# Patient Record
Sex: Male | Born: 1995 | Race: Black or African American | Hispanic: No | Marital: Single
Health system: Southern US, Community
[De-identification: ages and names within clinical notes are randomized; demographics above are authoritative.]

---

## 2018-02-20 ENCOUNTER — Emergency Department (HOSPITAL_COMMUNITY): Payer: BLUE CROSS/BLUE SHIELD

## 2018-02-20 ENCOUNTER — Emergency Department (HOSPITAL_COMMUNITY)
Admission: EM | Admit: 2018-02-20 | Discharge: 2018-02-20 | Disposition: A | Discharge status: Home / Self Care | Payer: BLUE CROSS/BLUE SHIELD | Attending: Emergency Medicine | Admitting: Emergency Medicine

## 2018-02-20 ENCOUNTER — Other Ambulatory Visit: Payer: Self-pay

## 2018-02-20 DIAGNOSIS — F1290 Cannabis use, unspecified, uncomplicated: Secondary | ICD-10-CM | POA: Insufficient documentation

## 2018-02-20 DIAGNOSIS — R112 Nausea with vomiting, unspecified: Secondary | ICD-10-CM | POA: Insufficient documentation

## 2018-02-20 DIAGNOSIS — R197 Diarrhea, unspecified: Secondary | ICD-10-CM | POA: Insufficient documentation

## 2018-02-20 DIAGNOSIS — R1084 Generalized abdominal pain: Secondary | ICD-10-CM | POA: Diagnosis not present

## 2018-02-20 LAB — CBC WITH DIFFERENTIAL/PLATELET
Abs Immature Granulocytes: 0.1 10*3/uL — ABNORMAL HIGH (ref 0.00–0.07)
BASOS ABS: 0 10*3/uL (ref 0.0–0.1)
Basophils Relative: 0 %
Eosinophils Absolute: 0 10*3/uL (ref 0.0–0.5)
Eosinophils Relative: 0 %
HEMATOCRIT: 42 % (ref 39.0–52.0)
HEMOGLOBIN: 12.7 g/dL — AB (ref 13.0–17.0)
IMMATURE GRANULOCYTES: 1 %
LYMPHS ABS: 2.4 10*3/uL (ref 0.7–4.0)
LYMPHS PCT: 15 %
MCH: 23.2 pg — ABNORMAL LOW (ref 26.0–34.0)
MCHC: 30.2 g/dL (ref 30.0–36.0)
MCV: 76.6 fL — ABNORMAL LOW (ref 80.0–100.0)
MONOS PCT: 8 %
Monocytes Absolute: 1.2 10*3/uL — ABNORMAL HIGH (ref 0.1–1.0)
NRBC: 0 % (ref 0.0–0.2)
Neutro Abs: 12.5 10*3/uL — ABNORMAL HIGH (ref 1.7–7.7)
Neutrophils Relative %: 76 %
Platelets: 249 10*3/uL (ref 150–400)
RBC: 5.48 MIL/uL (ref 4.22–5.81)
RDW: 13.5 % (ref 11.5–15.5)
WBC: 16.3 10*3/uL — ABNORMAL HIGH (ref 4.0–10.5)

## 2018-02-20 LAB — URINALYSIS, ROUTINE W REFLEX MICROSCOPIC
BILIRUBIN URINE: NEGATIVE
GLUCOSE, UA: NEGATIVE mg/dL
Hgb urine dipstick: NEGATIVE
Ketones, ur: NEGATIVE mg/dL
Leukocytes, UA: NEGATIVE
Nitrite: NEGATIVE
PH: 6 (ref 5.0–8.0)
Protein, ur: NEGATIVE mg/dL
Specific Gravity, Urine: 1.005 (ref 1.005–1.030)

## 2018-02-20 LAB — COMPREHENSIVE METABOLIC PANEL
ALBUMIN: 4.5 g/dL (ref 3.5–5.0)
ALK PHOS: 52 U/L (ref 38–126)
ALT: 19 U/L (ref 0–44)
AST: 27 U/L (ref 15–41)
Anion gap: 11 (ref 5–15)
BILIRUBIN TOTAL: 0.7 mg/dL (ref 0.3–1.2)
BUN: 19 mg/dL (ref 6–20)
CALCIUM: 9.1 mg/dL (ref 8.9–10.3)
CO2: 21 mmol/L — ABNORMAL LOW (ref 22–32)
Chloride: 99 mmol/L (ref 98–111)
Creatinine, Ser: 1.03 mg/dL (ref 0.61–1.24)
GFR calc Af Amer: >60 mL/min (ref >60)
GFR calc non Af Amer: >60 mL/min (ref >60)
GLUCOSE: 138 mg/dL — AB (ref 70–99)
Potassium: 3.2 mmol/L — ABNORMAL LOW (ref 3.5–5.1)
Sodium: 131 mmol/L — ABNORMAL LOW (ref 135–145)
TOTAL PROTEIN: 7.4 g/dL (ref 6.5–8.1)

## 2018-02-20 LAB — LIPASE, BLOOD: Lipase: 29 U/L (ref 11–51)

## 2018-02-20 MED ORDER — IOPAMIDOL (ISOVUE-300) INJECTION 61%
INTRAVENOUS | Status: AC
  Filled 2018-02-20: qty 100

## 2018-02-20 MED ORDER — IOPAMIDOL (ISOVUE-300) INJECTION 61%
100.0000 mL | Freq: Once | INTRAVENOUS | Status: AC | PRN
  Administered 2018-02-20: 100 mL via INTRAVENOUS

## 2018-02-20 MED ORDER — ONDANSETRON 4 MG PO TBDP: 4.0000 mg | ORAL_TABLET | Freq: Three times a day (TID) | ORAL | 0 refills | Status: AC | PRN

## 2018-02-20 MED ORDER — SODIUM CHLORIDE (PF) 0.9 % IJ SOLN
INTRAMUSCULAR | Status: AC
  Filled 2018-02-20: qty 50

## 2018-02-20 MED ORDER — ONDANSETRON 4 MG PO TBDP
4.0000 mg | ORAL_TABLET | Freq: Once | ORAL | Status: AC
  Administered 2018-02-20: 4 mg via ORAL
  Filled 2018-02-20: qty 1

## 2018-02-20 MED ORDER — FAMOTIDINE 20 MG PO TABS: 20.0000 mg | ORAL_TABLET | Freq: Two times a day (BID) | ORAL | 0 refills | Status: AC

## 2018-02-20 MED ORDER — DICYCLOMINE HCL 10 MG PO CAPS
10.0000 mg | ORAL_CAPSULE | Freq: Once | ORAL | Status: AC
  Administered 2018-02-20: 10 mg via ORAL
  Filled 2018-02-20: qty 1

## 2018-02-20 MED ORDER — POTASSIUM CHLORIDE CRYS ER 20 MEQ PO TBCR
40.0000 meq | EXTENDED_RELEASE_TABLET | Freq: Once | ORAL | Status: AC
  Administered 2018-02-20: 40 meq via ORAL
  Filled 2018-02-20: qty 2

## 2018-02-20 NOTE — ED Provider Notes (Signed)
[facility]-EMERGENCY DEPT Provider Note   CSN: [id number] Arrival date & time: 02/20/18  7829     History   Chief Complaint Chief Complaint  Patient presents with  . Abdominal Pain    HPI Clinton Gill is a 22 y.o. male with past medical history significant for iron deficiency anemia who presents for evaluation of abdominal pain.  Patient states he had a date yesterday evening and felt "full in my stomach so I took an enema."  Patient states that he was sitting on the toilet having a bowel movement when he states he had severe abdominal pain and his hands started trembling.  Patient states he had one episode of nonbloody, nonbilious emesis.  States he did "smoke a lot of weed right before the enema."  Admits to history of chronic marijuana use.  Patient states his roommate called EMS because he was worried about him.  Patient states his symptoms resolved and he drank 2 gallons of water to "rehydrate."  Patient states that he has continued to have mild abdominal cramping since his last bowel movement at approximately 1 AM.  Denies melena or bright red blood per rectum.  Patient states he usually takes enemas as he has chronic constipation.  Denies fever, chills, chest pain, shortness of breath, lightheadedness, dizziness, headache, vision changes, dysuria, focal abdominal pain.  Denies history of previous abdominal surgeries.  History obtained from patient.  No interpreter was used.  HPI  No past medical history on file.  There are no active problems to display for this patient.   The histories have been reviewed..   Home Medications    Prior to Admission medications   Medication Sig Start Date End Date Taking? Authorizing Provider  acetaminophen (TYLENOL) 500 MG tablet Take 1,000 mg by mouth daily as needed for headache.   Yes [provider]  famotidine (PEPCID) 20 MG tablet Take 1 tablet (20 mg total) by mouth 2 (two) times daily. 02/20/18   Lenin Kuhnle,  Chery Giusto A, PA-C  ondansetron (ZOFRAN ODT) 4 MG disintegrating tablet Take 1 tablet (4 mg total) by mouth every 8 (eight) hours as needed for nausea or vomiting. 02/20/18   Rickard Kennerly A, PA-C    Family History No family history on file.  Social History Social History   Tobacco Use  . Smoking status: Not on file  Substance Use Topics  . Alcohol use: Not on file  . Drug use: Not on file     Allergies   Penicillin g   Review of Systems Review of Systems  Constitutional: Negative.   Respiratory: Negative.   Cardiovascular: Negative.   Gastrointestinal: Positive for abdominal pain, diarrhea, nausea and vomiting. Negative for abdominal distention, anal bleeding, blood in stool, constipation and rectal pain.  Genitourinary: Negative.   Musculoskeletal: Negative.   Skin: Negative.   Neurological: Negative.   All other systems reviewed and are negative.    Physical Exam Updated Vital Signs BP 105/60 (BP Location: Left Arm)   Pulse 72   Temp 97.6 F (36.4 C) (Oral)   Resp 16   SpO2 99%   Physical Exam  Constitutional: He appears well-developed and well-nourished.  Non-toxic appearance. He does not appear ill. No distress.  HENT:  Head: Normocephalic and atraumatic.  Nose: Nose normal. Right sinus exhibits no maxillary sinus tenderness and no frontal sinus tenderness. Left sinus exhibits no maxillary sinus tenderness and no frontal sinus tenderness.  Mouth/Throat: Uvula is midline, oropharynx is clear and moist and mucous  membranes are normal. No trismus in the jaw. No uvula swelling. No oropharyngeal exudate, posterior oropharyngeal edema, posterior oropharyngeal erythema or tonsillar abscesses. No tonsillar exudate.  Eyes: Pupils are equal, round, and reactive to light. EOM and lids are normal.  Neck: Normal range of motion, full passive range of motion without pain and phonation normal. Neck supple. No neck rigidity. No edema, no erythema and normal range of motion  present.  Cardiovascular: Normal rate, regular rhythm, normal heart sounds, intact distal pulses and normal pulses. Exam reveals no gallop and no friction rub.  No murmur heard. Pulmonary/Chest: Effort normal and breath sounds normal. No accessory muscle usage or stridor. No tachypnea. No respiratory distress. He has no decreased breath sounds. He has no wheezes. He has no rhonchi. He has no rales. He exhibits no tenderness.  Abdominal: Soft. Normal appearance and bowel sounds are normal. He exhibits no shifting dullness, no distension, no pulsatile liver, no fluid wave, no abdominal bruit, no ascites, no pulsatile midline mass and no mass. There is no hepatosplenomegaly. There is no tenderness. There is no rigidity, no rebound, no guarding, no CVA tenderness, no tenderness at McBurney's point and negative Murphy's sign.  Musculoskeletal: Normal range of motion.  Moves all extremities without difficulty or without ataxia.  Neurological: He is alert. He has normal strength. He displays no atrophy and no tremor. No sensory deficit. He exhibits normal muscle tone. He displays no seizure activity.  Skin: Skin is warm and dry. He is not diaphoretic.  No rashes or lesions.  Psychiatric: He has a normal mood and affect.  Nursing note and vitals reviewed.    ED Treatments / Results  Labs (all labs ordered are listed, but only abnormal results are displayed) Labs Reviewed  CBC WITH DIFFERENTIAL/PLATELET - Abnormal; Notable for the following components:      Result Value   WBC 16.3 (*)    Hemoglobin 12.7 (*)    MCV 76.6 (*)    MCH 23.2 (*)    Neutro Abs 12.5 (*)    Monocytes Absolute 1.2 (*)    Abs Immature Granulocytes 0.10 (*)    All other components within normal limits  COMPREHENSIVE METABOLIC PANEL - Abnormal; Notable for the following components:   Sodium 131 (*)    Potassium 3.2 (*)    CO2 21 (*)    Glucose, Bld 138 (*)    All other components within normal limits  URINALYSIS, ROUTINE  W REFLEX MICROSCOPIC - Abnormal; Notable for the following components:   Color, Urine STRAW (*)    All other components within normal limits  LIPASE, BLOOD    EKG None  Radiology Ct Abdomen Pelvis W Contrast  Result Date: 02/20/2018 CLINICAL DATA:  Left-sided abdominal pain and nausea EXAM: CT ABDOMEN AND PELVIS WITH CONTRAST TECHNIQUE: Multidetector CT imaging of the abdomen and pelvis was performed using the standard protocol following bolus administration of intravenous contrast. CONTRAST:  ISOVUE-300 IOPAMIDOL (ISOVUE-300) INJECTION 61% COMPARISON:  None. FINDINGS: Lower chest: No acute abnormality. Hepatobiliary: No focal liver abnormality is seen. No gallstones, gallbladder wall thickening, or biliary dilatation. Pancreas: Unremarkable. No pancreatic ductal dilatation or surrounding inflammatory changes. Spleen: Normal in size without focal abnormality. Adrenals/Urinary Tract: Adrenal glands are unremarkable. Kidneys are normal, without renal calculi, focal lesion, or hydronephrosis. Bladder is unremarkable. Stomach/Bowel: Stomach is within normal limits. Appendix appears normal. No evidence of bowel wall thickening, distention, or inflammatory changes. Vascular/Lymphatic: No significant vascular findings are present. No enlarged abdominal or pelvic lymph  nodes. Reproductive: Prostate is unremarkable. Other: No abdominal wall hernia or abnormality. No abdominopelvic ascites. Musculoskeletal: No acute or significant osseous findings. IMPRESSION: No acute abnormality identified Electronically Signed   By: Alcide CleverMark  Lukens M.D.   On: 02/20/2018 09:51    Procedures Procedures (including critical care time)  Medications Ordered in ED Medications  dicyclomine (BENTYL) capsule 10 mg (10 mg Oral Given 02/20/18 0730)  ondansetron (ZOFRAN-ODT) disintegrating tablet 4 mg (4 mg Oral Given 02/20/18 0730)  potassium chloride SA (K-DUR,KLOR-CON) CR tablet 40 mEq (40 mEq Oral Given 02/20/18 0949)    iopamidol (ISOVUE-300) 61 % injection 100 mL (100 mLs Intravenous Contrast Given 02/20/18 0931)     Initial Impression / Assessment and Plan / ED Course  I have reviewed the triage vital signs and the nursing notes.  Pertinent labs & imaging results that were available during my care of the patient were reviewed by me and considered in my medical decision making (see chart for details).  22 year old male who appears otherwise well presents for evaluation of abdominal pain.  One episode of nonbloody, nonbilious emesis with one episode of diarrhea without melena or bright red blood per rectum.  Admits to history of chronic marijuana use.  Did use enema yesterday evening before his episode of diarrhea.  Admits to history of chronic constipation.  States he drank 2 gallons of water and attempt to rehydrate after his episode of diarrhea.  Was able to tolerate p.o. intake without subsequent episodes of emesis.  Abdomen without tenderness, rebound, guarding or rigidity.  Mucous membranes moist. Will obtain labs, urinalysis and re-evalaute. Will hold on IV fluids as patient recently drank 2 gallons of water and does not appear clinically dehydrated on exam.  0830: On re-evaluation patient without abdominal pain and has not had any episodes of emesis in department.  Labs with leukocytosis at 16.3, mild hypokalemia 3.2, provide supplementation department., mild hyponatremia at 131, elevation in glucose at 136, urinalysis not consistent with infection.  Given elevated WBC will obtain CT scan.  1000: CT scan negative for any acute abdominal pathology.  Abdomen nontender without rebound, rigidity or guarding on reevaluation. Patient is nontoxic, nonseptic appearing, in no apparent distress. Patient does not meet the SIRS or Sepsis criteria.  On repeat exam patient does not have a surgical abdomin and there are no peritoneal signs.  No indication of appendicitis, bowel obstruction, bowel perforation, cholecystitis,  diverticulitis.  Patient discharged home with symptomatic treatment and given strict instructions for follow-up with their primary care physician.  I have also discussed reasons to return immediately to the ER.  Patient expresses understanding and agrees with plan.    Final Clinical Impressions(s) / ED Diagnoses   Final diagnoses:  Generalized abdominal pain  Non-intractable vomiting with nausea, unspecified vomiting type    ED Discharge Orders         Ordered    ondansetron (ZOFRAN ODT) 4 MG disintegrating tablet  Every 8 hours PRN     02/20/18 1026    famotidine (PEPCID) 20 MG tablet  2 times daily     02/20/18 1026           Delonta Yohannes A, PA-C 02/20/18 1553    Terrilee FilesButler, Michael C, MD 02/20/18 1743

## 2018-02-20 NOTE — ED Notes (Signed)
Patient transported to CT 

## 2018-02-20 NOTE — ED Notes (Signed)
Pt has had several drinks of water with his meds and tolerated well.

## 2018-02-20 NOTE — ED Notes (Signed)
Pt made aware of need for urine sample.  

## 2018-02-20 NOTE — Discharge Instructions (Addendum)
Your evaluated today for abdominal pain as well as nausea and vomiting.  This is most likely from a combination of marijuana use as well as the laxative you took.  I have given you Zofran for nausea.  This is a disintegrating tablet which he placed on your tongue.  We suggest you stop smoking marijuana.   Return to the ED for new or worsening symptoms such as:  Get help right away if:  Your pain does not go away as soon as your doctor says it should. You cannot stop throwing up. Your pain is only in areas of your belly, such as the right side or the left lower part of the belly. You have bloody or black poop, or poop that looks like tar. You have very bad pain, cramping, or bloating in your belly. You have signs of not having enough fluid or water in your body (dehydration), such as: Dark pee, very little pee, or no pee. Cracked lips. Dry mouth. Sunken eyes. Sleepiness. Weakness.

## 2018-02-20 NOTE — ED Triage Notes (Signed)
Pt's roommates called 911 because pt was throwing up, shaking, pt c/o abdominal pain, and because he was not acting like his normal self.  Pt had an enema tonight to prepare for a date.  Pt drank 2 gallons of water tonight in attempt to rehydrate.  Pt admits to marijuana use tonight.  Hx Iron deficiency anemia.

## 2019-03-10 ENCOUNTER — Other Ambulatory Visit: Payer: Self-pay

## 2019-03-10 DIAGNOSIS — Z20822 Contact with and (suspected) exposure to covid-19: Secondary | ICD-10-CM

## 2019-03-12 LAB — NOVEL CORONAVIRUS, NAA: SARS-CoV-2, NAA: NOT DETECTED

## 2019-05-10 IMAGING — CT CT ABD-PELV W/ CM
2 of 4 series · 16 of 46 positions shown, 18 images · IV contrast (iopamidol)
Comparison: None.

CLINICAL DATA: Left-sided abdominal pain and nausea

EXAM:
CT ABDOMEN AND PELVIS WITH CONTRAST
TECHNIQUE: Multidetector CT imaging of the abdomen and pelvis was performed
using the standard protocol following bolus administration of
intravenous contrast.
CONTRAST:  100mL MQGYRR-YNN IOPAMIDOL (MQGYRR-YNN) INJECTION 61%

[Series 2: axial st · axial · 0.75mm/px · z∈[+1116,+1556]mm · 13 of 98 slices shown, 15 images]
[im 5/98  soft-tissue]
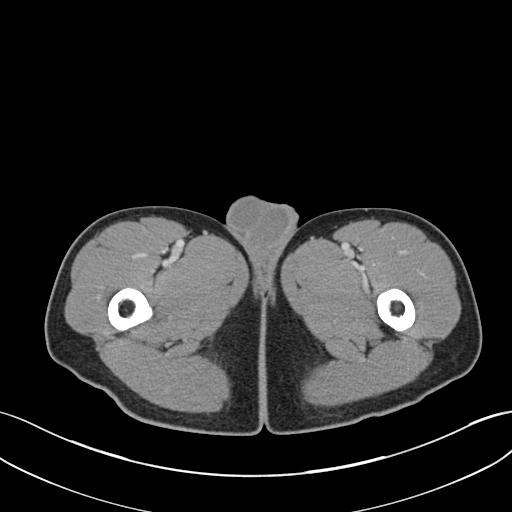
[im 5/98  bone]
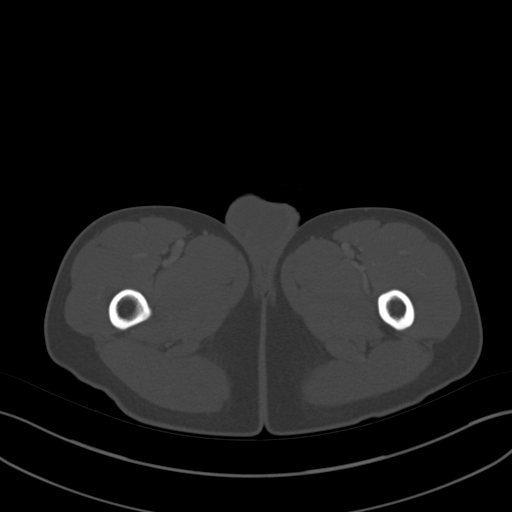
[im 14/98  soft-tissue]
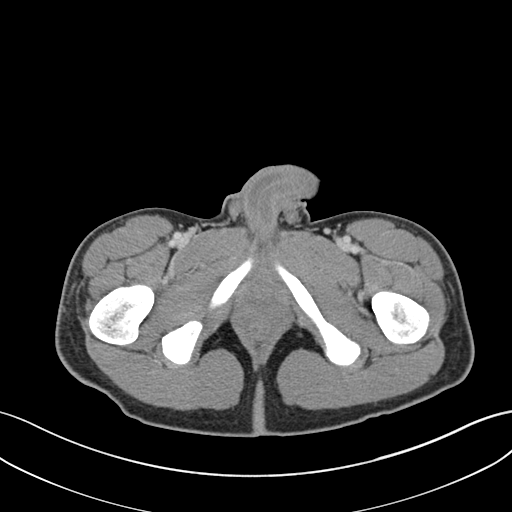
[im 19/98  soft-tissue]
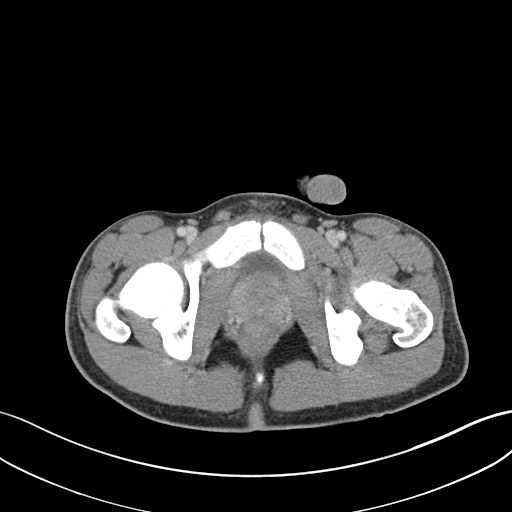
[im 28/98  soft-tissue]
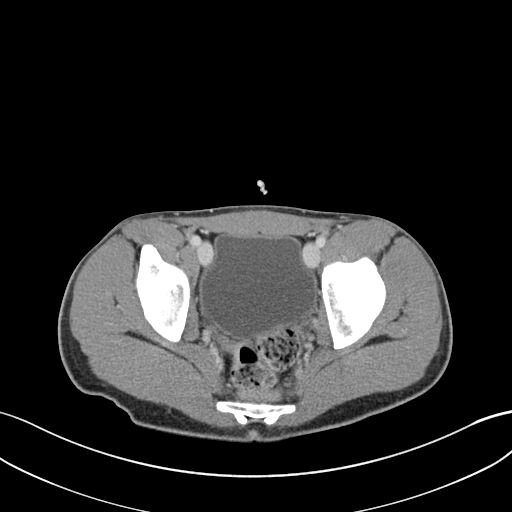
[im 33/98  soft-tissue]
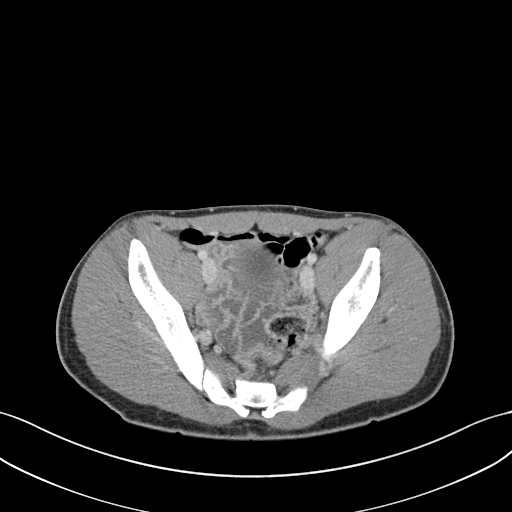
[im 42/98  soft-tissue]
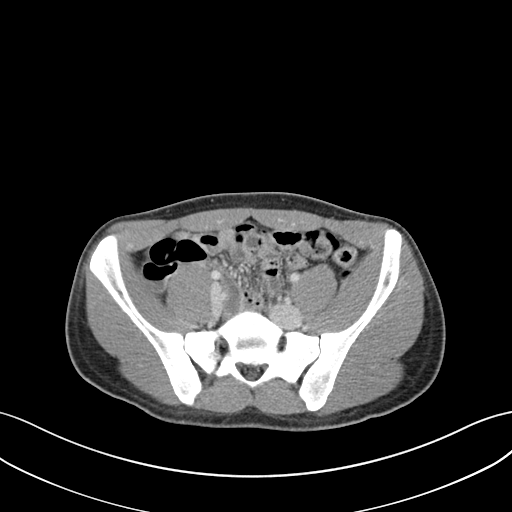
[im 51/98  soft-tissue]
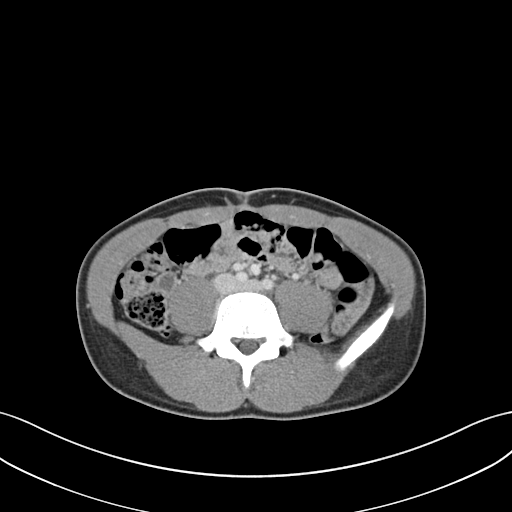
[im 56/98  soft-tissue]
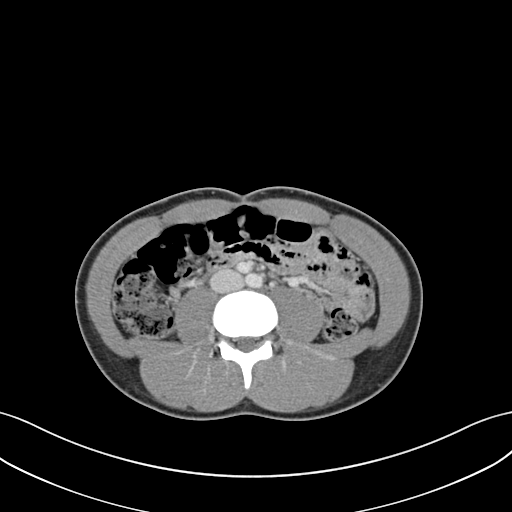
[im 65/98  soft-tissue]
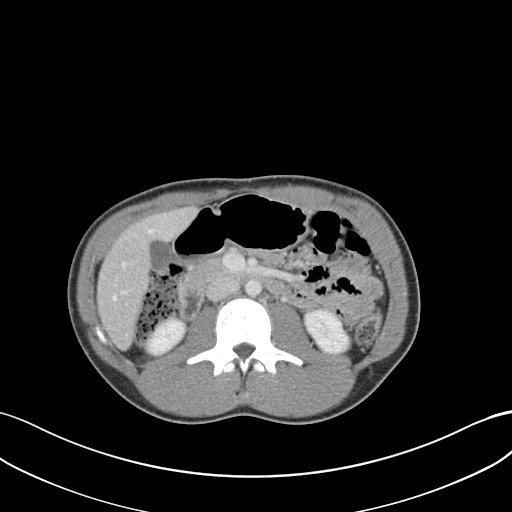
[im 65/98  bone]
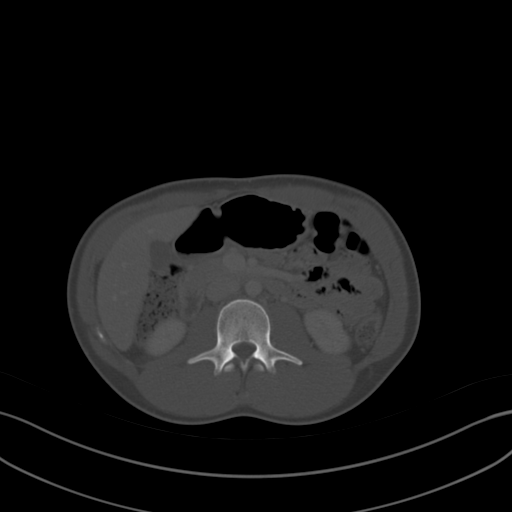
[im 70/98  soft-tissue]
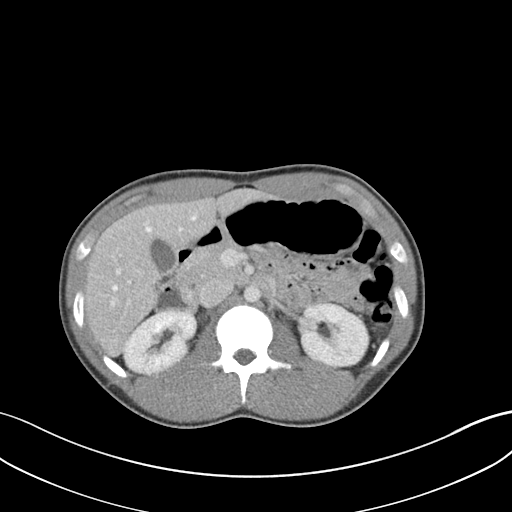
[im 79/98  soft-tissue]
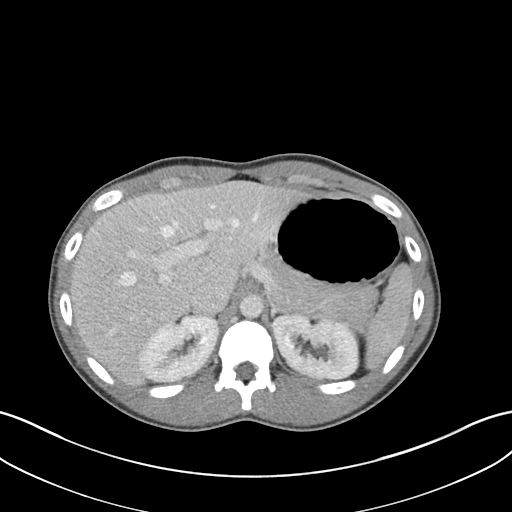
[im 84/98  soft-tissue]
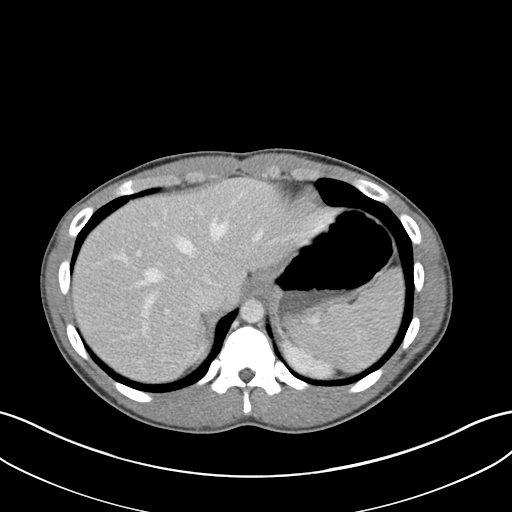
[im 93/98  soft-tissue]
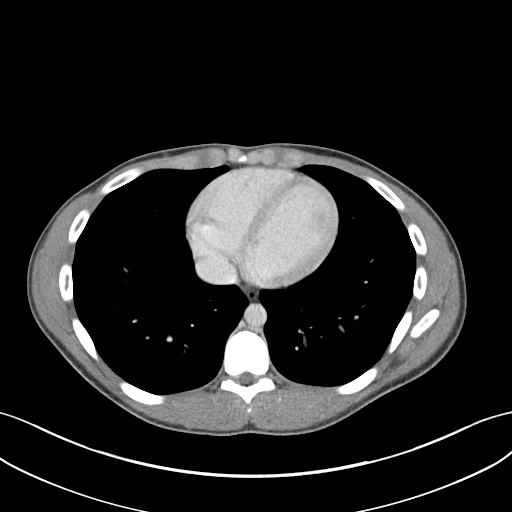

[Series 4: coronal st · coronal · 0.62mm/px · 3 of 71 slices shown]
[im 24/71  soft-tissue]
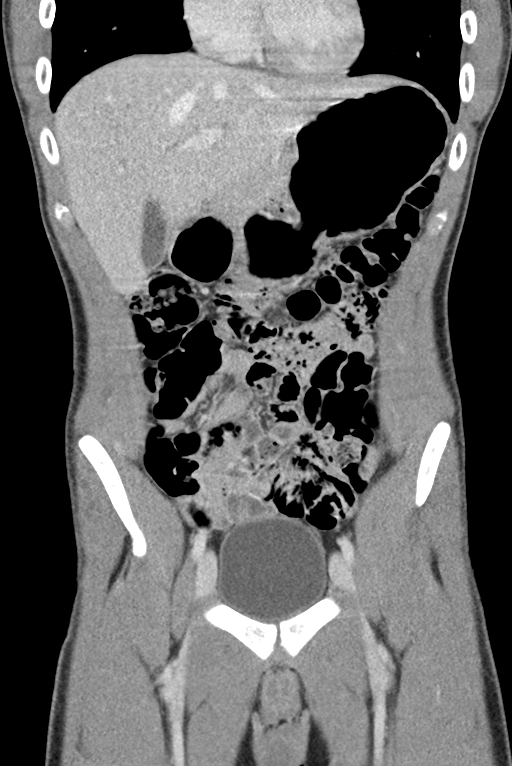
[im 32/71  soft-tissue]
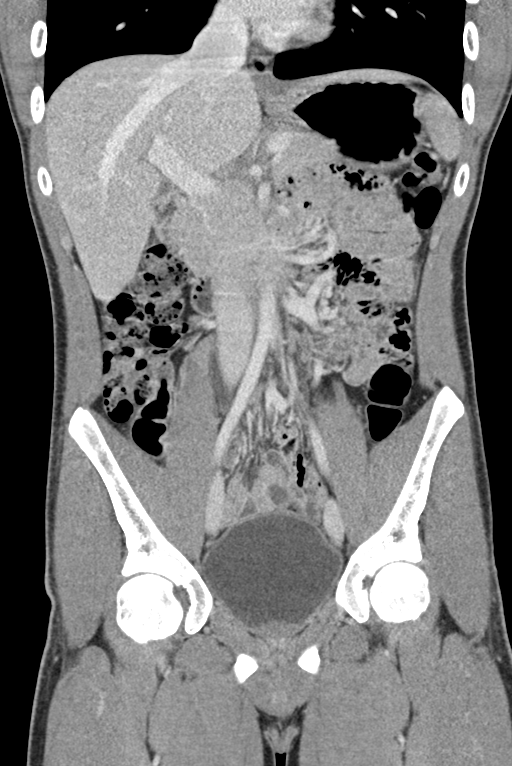
[im 39/71  soft-tissue]
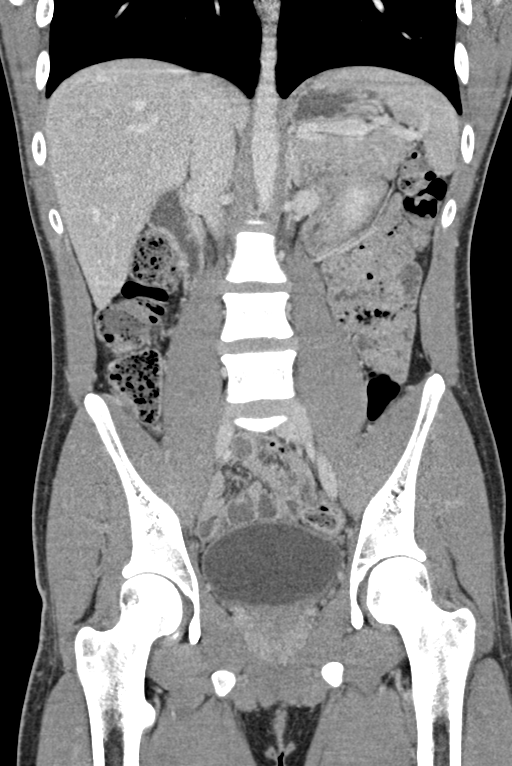

[16 of 46 positions shown; findings below may reference images not displayed]

FINDINGS: Lower chest: No acute abnormality.

Hepatobiliary: No focal liver abnormality is seen. No gallstones,
gallbladder wall thickening, or biliary dilatation.

Pancreas: Unremarkable. No pancreatic ductal dilatation or
surrounding inflammatory changes.

Spleen: Normal in size without focal abnormality.

Adrenals/Urinary Tract: Adrenal glands are unremarkable. Kidneys are
normal, without renal calculi, focal lesion, or hydronephrosis.
Bladder is unremarkable.

Stomach/Bowel: Stomach is within normal limits. Appendix appears
normal. No evidence of bowel wall thickening, distention, or
inflammatory changes.

Vascular/Lymphatic: No significant vascular findings are present. No
enlarged abdominal or pelvic lymph nodes.

Reproductive: Prostate is unremarkable.

Other: No abdominal wall hernia or abnormality. No abdominopelvic
ascites.

Musculoskeletal: No acute or significant osseous findings.
IMPRESSION: No acute abnormality identified
